# Patient Record
Sex: Female | Born: 1965 | Race: Black or African American | Hispanic: No | Marital: Married | Smoking: Never smoker
Health system: Southern US, Community
[De-identification: ages and names within clinical notes are randomized; demographics above are authoritative.]

## PROBLEM LIST (undated history)

## (undated) DIAGNOSIS — D649 Anemia, unspecified: Secondary | ICD-10-CM

## (undated) DIAGNOSIS — J189 Pneumonia, unspecified organism: Secondary | ICD-10-CM

## (undated) HISTORY — PX: ABDOMINAL HYSTERECTOMY: SHX81

---

## 1999-05-22 ENCOUNTER — Emergency Department (HOSPITAL_COMMUNITY): Admission: EM | Admit: 1999-05-22 | Discharge: 1999-05-22 | Payer: Self-pay | Admitting: Emergency Medicine

## 2003-10-06 ENCOUNTER — Encounter: Admission: RE | Admit: 2003-10-06 | Discharge: 2003-10-06 | Payer: Self-pay | Admitting: *Deleted

## 2003-10-06 ENCOUNTER — Ambulatory Visit (HOSPITAL_COMMUNITY): Admission: RE | Admit: 2003-10-06 | Discharge: 2003-10-06 | Payer: Self-pay | Admitting: *Deleted

## 2003-10-15 ENCOUNTER — Encounter: Admission: RE | Admit: 2003-10-15 | Discharge: 2003-10-15 | Payer: Self-pay | Admitting: *Deleted

## 2003-10-19 ENCOUNTER — Observation Stay (HOSPITAL_COMMUNITY): Admission: AD | Admit: 2003-10-19 | Discharge: 2003-10-20 | Payer: Self-pay | Admitting: Obstetrics and Gynecology

## 2003-10-22 ENCOUNTER — Encounter: Admission: RE | Admit: 2003-10-22 | Discharge: 2003-10-22 | Payer: Self-pay | Admitting: *Deleted

## 2003-10-27 ENCOUNTER — Inpatient Hospital Stay (HOSPITAL_COMMUNITY): Admission: RE | Admit: 2003-10-27 | Discharge: 2003-10-28 | Payer: Self-pay | Admitting: *Deleted

## 2003-11-04 ENCOUNTER — Encounter: Admission: RE | Admit: 2003-11-04 | Discharge: 2003-11-04 | Payer: Self-pay | Admitting: *Deleted

## 2003-11-18 ENCOUNTER — Encounter: Admission: RE | Admit: 2003-11-18 | Discharge: 2003-11-18 | Payer: Self-pay | Admitting: *Deleted

## 2003-11-25 ENCOUNTER — Inpatient Hospital Stay (HOSPITAL_COMMUNITY): Admission: AD | Admit: 2003-11-25 | Discharge: 2003-11-29 | Payer: Self-pay | Admitting: Obstetrics & Gynecology

## 2003-11-25 ENCOUNTER — Encounter: Admission: RE | Admit: 2003-11-25 | Discharge: 2003-11-25 | Payer: Self-pay | Admitting: *Deleted

## 2003-12-02 ENCOUNTER — Encounter: Admission: RE | Admit: 2003-12-02 | Discharge: 2003-12-02 | Payer: Self-pay | Admitting: *Deleted

## 2003-12-09 ENCOUNTER — Encounter: Admission: RE | Admit: 2003-12-09 | Discharge: 2003-12-09 | Payer: Self-pay | Admitting: *Deleted

## 2003-12-10 ENCOUNTER — Ambulatory Visit (HOSPITAL_COMMUNITY): Admission: RE | Admit: 2003-12-10 | Discharge: 2003-12-10 | Payer: Self-pay | Admitting: *Deleted

## 2003-12-16 ENCOUNTER — Encounter: Admission: RE | Admit: 2003-12-16 | Discharge: 2003-12-16 | Payer: Self-pay | Admitting: *Deleted

## 2003-12-23 ENCOUNTER — Encounter: Admission: RE | Admit: 2003-12-23 | Discharge: 2003-12-23 | Payer: Self-pay | Admitting: *Deleted

## 2003-12-30 ENCOUNTER — Encounter: Admission: RE | Admit: 2003-12-30 | Discharge: 2003-12-30 | Payer: Self-pay | Admitting: *Deleted

## 2003-12-30 ENCOUNTER — Ambulatory Visit (HOSPITAL_COMMUNITY): Admission: RE | Admit: 2003-12-30 | Discharge: 2003-12-30 | Payer: Self-pay | Admitting: *Deleted

## 2004-01-04 ENCOUNTER — Inpatient Hospital Stay (HOSPITAL_COMMUNITY): Admission: AD | Admit: 2004-01-04 | Discharge: 2004-01-12 | Payer: Self-pay | Admitting: Obstetrics & Gynecology

## 2015-10-26 ENCOUNTER — Other Ambulatory Visit: Payer: Self-pay | Admitting: Internal Medicine

## 2015-10-26 DIAGNOSIS — E785 Hyperlipidemia, unspecified: Secondary | ICD-10-CM

## 2015-12-21 ENCOUNTER — Other Ambulatory Visit: Payer: Self-pay | Admitting: Internal Medicine

## 2015-12-21 DIAGNOSIS — E559 Vitamin D deficiency, unspecified: Secondary | ICD-10-CM

## 2016-02-24 DIAGNOSIS — J189 Pneumonia, unspecified organism: Secondary | ICD-10-CM

## 2016-02-24 HISTORY — DX: Pneumonia, unspecified organism: J18.9

## 2016-06-17 ENCOUNTER — Emergency Department (HOSPITAL_BASED_OUTPATIENT_CLINIC_OR_DEPARTMENT_OTHER): Payer: Self-pay

## 2016-06-17 ENCOUNTER — Emergency Department (HOSPITAL_BASED_OUTPATIENT_CLINIC_OR_DEPARTMENT_OTHER)
Admission: EM | Admit: 2016-06-17 | Discharge: 2016-06-17 | Disposition: A | Discharge status: Home / Self Care | Payer: Self-pay | Attending: Emergency Medicine | Admitting: Emergency Medicine

## 2016-06-17 ENCOUNTER — Encounter (HOSPITAL_BASED_OUTPATIENT_CLINIC_OR_DEPARTMENT_OTHER): Payer: Self-pay

## 2016-06-17 DIAGNOSIS — R519 Headache, unspecified: Secondary | ICD-10-CM

## 2016-06-17 DIAGNOSIS — M436 Torticollis: Secondary | ICD-10-CM | POA: Insufficient documentation

## 2016-06-17 DIAGNOSIS — R51 Headache: Secondary | ICD-10-CM

## 2016-06-17 DIAGNOSIS — H9202 Otalgia, left ear: Secondary | ICD-10-CM | POA: Insufficient documentation

## 2016-06-17 HISTORY — DX: Pneumonia, unspecified organism: J18.9

## 2016-06-17 HISTORY — DX: Anemia, unspecified: D64.9

## 2016-06-17 LAB — CBC WITH DIFFERENTIAL/PLATELET
BASOS ABS: 0 10*3/uL (ref 0.0–0.1)
Basophils Relative: 0 %
Eosinophils Absolute: 0.2 10*3/uL (ref 0.0–0.7)
Eosinophils Relative: 2 %
HCT: 39.1 % (ref 36.0–46.0)
Hemoglobin: 12.8 g/dL (ref 12.0–15.0)
Lymphocytes Relative: 15 %
Lymphs Abs: 1.3 10*3/uL (ref 0.7–4.0)
MCH: 28.4 pg (ref 26.0–34.0)
MCHC: 32.7 g/dL (ref 30.0–36.0)
MCV: 86.7 fL (ref 78.0–100.0)
Monocytes Absolute: 0.9 10*3/uL (ref 0.1–1.0)
Monocytes Relative: 11 %
Neutro Abs: 5.9 10*3/uL (ref 1.7–7.7)
Neutrophils Relative %: 72 %
Platelets: 239 10*3/uL (ref 150–400)
RBC: 4.51 MIL/uL (ref 3.87–5.11)
RDW: 15.3 % (ref 11.5–15.5)
WBC: 8.3 10*3/uL (ref 4.0–10.5)

## 2016-06-17 LAB — BASIC METABOLIC PANEL
Anion gap: 6 (ref 5–15)
BUN: 15 mg/dL (ref 6–20)
CALCIUM: 9.2 mg/dL (ref 8.9–10.3)
CO2: 28 mmol/L (ref 22–32)
Chloride: 105 mmol/L (ref 101–111)
Creatinine, Ser: 0.76 mg/dL (ref 0.44–1.00)
GFR calc Af Amer: >60 mL/min (ref >60)
GFR calc non Af Amer: >60 mL/min (ref >60)
Glucose, Bld: 103 mg/dL — ABNORMAL HIGH (ref 65–99)
Potassium: 3.6 mmol/L (ref 3.5–5.1)
SODIUM: 139 mmol/L (ref 135–145)

## 2016-06-17 LAB — TROPONIN I: Troponin I: <0.03 ng/mL (ref <0.03)

## 2016-06-17 MED ORDER — NAPROXEN 500 MG PO TABS: 500.0000 mg | ORAL_TABLET | Freq: Two times a day (BID) | ORAL | 0 refills | Status: AC

## 2016-06-17 MED ORDER — MAGNESIUM SULFATE 2 GM/50ML IV SOLN
2.0000 g | Freq: Once | INTRAVENOUS | Status: AC
  Administered 2016-06-17: 2 g via INTRAVENOUS
  Filled 2016-06-17: qty 50

## 2016-06-17 MED ORDER — DIPHENHYDRAMINE HCL 50 MG/ML IJ SOLN
25.0000 mg | Freq: Once | INTRAMUSCULAR | Status: AC
  Administered 2016-06-17: 25 mg via INTRAVENOUS
  Filled 2016-06-17: qty 1

## 2016-06-17 MED ORDER — KETOROLAC TROMETHAMINE 30 MG/ML IJ SOLN
30.0000 mg | Freq: Once | INTRAMUSCULAR | Status: AC
  Administered 2016-06-17: 30 mg via INTRAVENOUS
  Filled 2016-06-17: qty 1

## 2016-06-17 MED ORDER — DEXAMETHASONE SODIUM PHOSPHATE 10 MG/ML IJ SOLN
10.0000 mg | Freq: Once | INTRAMUSCULAR | Status: AC
  Administered 2016-06-17: 10 mg via INTRAVENOUS
  Filled 2016-06-17: qty 1

## 2016-06-17 MED ORDER — METOCLOPRAMIDE HCL 10 MG PO TABS: 10.0000 mg | ORAL_TABLET | Freq: Four times a day (QID) | ORAL | 0 refills | Status: AC | PRN

## 2016-06-17 MED ORDER — METOCLOPRAMIDE HCL 5 MG/ML IJ SOLN
10.0000 mg | Freq: Once | INTRAMUSCULAR | Status: AC
  Administered 2016-06-17: 10 mg via INTRAVENOUS
  Filled 2016-06-17: qty 2

## 2016-06-17 MED ORDER — DIVALPROEX SODIUM ER 250 MG PO TB24
ORAL_TABLET | ORAL | Status: AC
  Administered 2016-06-17: 500 mg
  Filled 2016-06-17: qty 2

## 2016-06-17 MED ORDER — DIVALPROEX SODIUM 500 MG PO DR TAB
500.0000 mg | DELAYED_RELEASE_TABLET | Freq: Two times a day (BID) | ORAL | Status: DC
  Filled 2016-06-17: qty 1

## 2016-06-17 MED ORDER — METHOCARBAMOL 1000 MG/10ML IJ SOLN
1000.0000 mg | Freq: Once | INTRAMUSCULAR | Status: AC
  Administered 2016-06-17: 1000 mg via INTRAVENOUS
  Filled 2016-06-17: qty 10

## 2016-06-17 MED ORDER — PROMETHAZINE HCL 25 MG/ML IJ SOLN
25.0000 mg | Freq: Once | INTRAMUSCULAR | Status: AC
  Administered 2016-06-17: 25 mg via INTRAVENOUS
  Filled 2016-06-17: qty 1

## 2016-06-17 MED ORDER — METHOCARBAMOL 500 MG PO TABS: 500.0000 mg | ORAL_TABLET | Freq: Two times a day (BID) | ORAL | 0 refills | Status: AC

## 2016-06-17 NOTE — ED Provider Notes (Signed)
[facility] Provider Note   CSN: [id number] Arrival date & time: 06/17/16  0357     History   Chief Complaint Chief Complaint  Patient presents with  . Facial Pain    HPI Theresa Davies is a 50 y.o. female.  The history is provided by the patient.  Otalgia  This is a new problem. The current episode started yesterday. There is pain in the left ear. The problem occurs constantly. The problem has not changed since onset.There has been no fever. The pain is severe. Pertinent negatives include no ear discharge, no hearing loss, no rhinorrhea, no sore throat, no abdominal pain, no diarrhea, no vomiting, no cough and no rash. Associated symptoms comments: Facial pain.. Her past medical history does not include chronic ear infection.  Awoke yesterday am with left sided facial pain and ear pain that radiates all the way into the clavicle on the left.  No f/c/r. No n/v/d.  No rashes.  Patient states she did sleep on that side the previous night.  Movement makes the pain worse.  She states that neither ibuprofen at 5 pm or percocet helped the pain.  Her pain is best when she leans forward and worse when she moves head from side to side.    Past Medical History:  Diagnosis Date  . Anemia   . PNA (pneumonia) 02/2016    There are no active problems to display for this patient.   Past Surgical History:  Procedure Laterality Date  . ABDOMINAL HYSTERECTOMY      OB History    No data available       Home Medications    Prior to Admission medications   Not on File    Family History No family history on file.  Social History Social History  Substance Use Topics  . Smoking status: Never Smoker  . Smokeless tobacco: Never Used  . Alcohol use No     Allergies   Review of patient's allergies indicates no known allergies.   Review of Systems Review of Systems  Constitutional: Negative for chills, diaphoresis and fever.  HENT: Positive for ear pain. Negative  for congestion, drooling, ear discharge, facial swelling, hearing loss, rhinorrhea, sinus pressure, sore throat, tinnitus, trouble swallowing and voice change.   Eyes: Negative for photophobia and visual disturbance.  Respiratory: Negative for cough.   Cardiovascular: Negative for chest pain.  Gastrointestinal: Negative for abdominal pain, diarrhea and vomiting.  Musculoskeletal: Negative for gait problem.  Skin: Negative for rash.  Neurological: Negative for dizziness, tremors, seizures, syncope, facial asymmetry, weakness, light-headedness and numbness.  Psychiatric/Behavioral: Negative for confusion.  All other systems reviewed and are negative.    Physical Exam Updated Vital Signs BP (!) 190/106 (BP Location: Right Arm)   Pulse 69   Temp 97.4 F (36.3 C) (Oral)   Resp 20   Ht 5\' 10"  (1.778 m)   Wt 270 lb (122.5 kg)   SpO2 100%   BMI 38.74 kg/m   Physical Exam  Constitutional: She is oriented to person, place, and time. She appears well-developed and well-nourished. No distress.  HENT:  Head: Normocephalic and atraumatic.  Right Ear: External ear normal. No mastoid tenderness. Tympanic membrane is not injected, not scarred, not perforated and not erythematous.  Left Ear: External ear normal. No mastoid tenderness. Tympanic membrane is not injected, not scarred, not perforated and not erythematous. No hemotympanum.  Nose: Nose normal.  Mouth/Throat: Oropharynx is clear and moist. No oropharyngeal exudate.  Eyes: Conjunctivae  and EOM are normal. Pupils are equal, round, and reactive to light.  No proptosis, no nystagmus  Neck: Trachea normal and phonation normal. Neck supple. No JVD present. No spinous process tenderness and no muscular tenderness present. No neck rigidity. No tracheal deviation, no edema and no erythema present. No Brudzinski's sign and no Kernig's sign noted.  Cardiovascular: Normal rate, regular rhythm and intact distal pulses.   Pulmonary/Chest: Effort  normal and breath sounds normal. No stridor. No respiratory distress. She has no wheezes. She has no rales.  Abdominal: Soft. Bowel sounds are normal. She exhibits no mass. There is no tenderness. There is no guarding.  Musculoskeletal: Normal range of motion.  Lymphadenopathy:    She has no cervical adenopathy.  Neurological: She is alert and oriented to person, place, and time. She has normal reflexes. She displays normal reflexes. No cranial nerve deficit.  Skin: Skin is warm and dry. Capillary refill takes less than 2 seconds. She is not diaphoretic.  Psychiatric: Thought content normal.  Intact cognition.    Nursing note and vitals reviewed.    ED Treatments / Results   Vitals:   06/17/16 0411  BP: (!) 190/106  Pulse: 69  Resp: 20  Temp: 97.4 F (36.3 C)   Results for orders placed or performed during the hospital encounter of 06/17/16  CBC with Differential/Platelet  Result Value Ref Range   WBC 8.3 4.0 - 10.5 K/uL   RBC 4.51 3.87 - 5.11 MIL/uL   Hemoglobin 12.8 12.0 - 15.0 g/dL   HCT 54.0 98.1 - 19.1 %   MCV 86.7 78.0 - 100.0 fL   MCH 28.4 26.0 - 34.0 pg   MCHC 32.7 30.0 - 36.0 g/dL   RDW 47.8 29.5 - 62.1 %   Platelets 239 150 - 400 K/uL   Neutrophils Relative % 72 %   Neutro Abs 5.9 1.7 - 7.7 K/uL   Lymphocytes Relative 15 %   Lymphs Abs 1.3 0.7 - 4.0 K/uL   Monocytes Relative 11 %   Monocytes Absolute 0.9 0.1 - 1.0 K/uL   Eosinophils Relative 2 %   Eosinophils Absolute 0.2 0.0 - 0.7 K/uL   Basophils Relative 0 %   Basophils Absolute 0.0 0.0 - 0.1 K/uL  Basic metabolic panel  Result Value Ref Range   Sodium 139 135 - 145 mmol/L   Potassium 3.6 3.5 - 5.1 mmol/L   Chloride 105 101 - 111 mmol/L   CO2 28 22 - 32 mmol/L   Glucose, Bld 103 (H) 65 - 99 mg/dL   BUN 15 6 - 20 mg/dL   Creatinine, Ser 3.08 0.44 - 1.00 mg/dL   Calcium 9.2 8.9 - 65.7 mg/dL   GFR calc non Af Amer >60 >60 mL/min   GFR calc Af Amer >60 >60 mL/min   Anion gap 6 5 - 15  Troponin I    Result Value Ref Range   Troponin I <0.03 <0.03 ng/mL   Ct Head Wo Contrast  Result Date: 06/17/2016 CLINICAL DATA:  Pain in the left side of the jaw, neck, year, and I all day. Worse when lying down. Nausea. EXAM: CT HEAD WITHOUT CONTRAST TECHNIQUE: Contiguous axial images were obtained from the base of the skull through the vertex without intravenous contrast. COMPARISON:  02/14/2016 FINDINGS: Brain: No evidence of acute infarction, hemorrhage, hydrocephalus, extra-axial collection or mass lesion/mass effect. Vascular: No hyperdense vessel or unexpected calcification. Skull: Normal. Negative for fracture or focal lesion. Sinuses/Orbits: No acute finding. Other: No significant  change in the intracranial contents since previous study. IMPRESSION: No acute intracranial abnormalities. Electronically Signed   By: Burman NievesWilliam  Stevens M.D.   On: 06/17/2016 05:33     EKG  EKG Interpretation  Date/Time:  Saturday June 17 2016 05:05:32 EDT Ventricular Rate:  62 PR Interval:    QRS Duration: 98 QT Interval:  447 QTC Calculation: 454 R Axis:   14 Text Interpretation:  Sinus rhythm Confirmed by [place]  MD, Deshonna Trnka ([id number]) on 06/17/2016 5:37:07 AM       Radiology No results found.  Procedures Procedures (including critical care time)  Medications Ordered in ED Results for orders placed or performed during the hospital encounter of 06/17/16  CBC with Differential/Platelet  Result Value Ref Range   WBC 8.3 4.0 - 10.5 K/uL   RBC 4.51 3.87 - 5.11 MIL/uL   Hemoglobin 12.8 12.0 - 15.0 g/dL   HCT 86.539.1 78.436.0 - 69.646.0 %   MCV 86.7 78.0 - 100.0 fL   MCH 28.4 26.0 - 34.0 pg   MCHC 32.7 30.0 - 36.0 g/dL   RDW 29.515.3 28.411.5 - 13.215.5 %   Platelets 239 150 - 400 K/uL   Neutrophils Relative % 72 %   Neutro Abs 5.9 1.7 - 7.7 K/uL   Lymphocytes Relative 15 %   Lymphs Abs 1.3 0.7 - 4.0 K/uL   Monocytes Relative 11 %   Monocytes Absolute 0.9 0.1 - 1.0 K/uL   Eosinophils Relative 2 %   Eosinophils  Absolute 0.2 0.0 - 0.7 K/uL   Basophils Relative 0 %   Basophils Absolute 0.0 0.0 - 0.1 K/uL  Basic metabolic panel  Result Value Ref Range   Sodium 139 135 - 145 mmol/L   Potassium 3.6 3.5 - 5.1 mmol/L   Chloride 105 101 - 111 mmol/L   CO2 28 22 - 32 mmol/L   Glucose, Bld 103 (H) 65 - 99 mg/dL   BUN 15 6 - 20 mg/dL   Creatinine, Ser 4.400.76 0.44 - 1.00 mg/dL   Calcium 9.2 8.9 - 10.210.3 mg/dL   GFR calc non Af Amer >60 >60 mL/min   GFR calc Af Amer >60 >60 mL/min   Anion gap 6 5 - 15  Troponin I  Result Value Ref Range   Troponin I <0.03 <0.03 ng/mL   Ct Head Wo Contrast  Result Date: 06/17/2016 CLINICAL DATA:  Pain in the left side of the jaw, neck, year, and I all day. Worse when lying down. Nausea. EXAM: CT HEAD WITHOUT CONTRAST TECHNIQUE: Contiguous axial images were obtained from the base of the skull through the vertex without intravenous contrast. COMPARISON:  02/14/2016 FINDINGS: Brain: No evidence of acute infarction, hemorrhage, hydrocephalus, extra-axial collection or mass lesion/mass effect. Vascular: No hyperdense vessel or unexpected calcification. Skull: Normal. Negative for fracture or focal lesion. Sinuses/Orbits: No acute finding. Other: No significant change in the intracranial contents since previous study. IMPRESSION: No acute intracranial abnormalities. Electronically Signed   By: Burman NievesWilliam  Stevens M.D.   On: 06/17/2016 05:33  Heat applied to the neck.  Patient now sleeping  Initial Impression / Assessment and Plan / ED Course  I have reviewed the triage vital signs and the nursing notes.  Pertinent labs & imaging results that were available during my care of the patient were reviewed by me and considered in my medical decision making (see chart for details).   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  Given that  symptoms have been going on for 24 hours, we would have seen changes on the head CT.  All questions answered to patient's satisfaction. Based on history and exam patient has been appropriately medically screened and emergency conditions excluded. Patient is stable for discharge at this time. Follow up with your PMD for recheck in 2 days and strict return precautions given New Prescriptions New Prescriptions   No medications on file     Errik Mitchelle, MD 06/17/16 [phone]

## 2016-06-17 NOTE — ED Triage Notes (Signed)
Pt c/o pain to lt side of jaw, neck, ear, eye pain all day and worse when laying down, states is nauseated now

## 2018-07-26 IMAGING — CT CT HEAD W/O CM
3 series · 15 of 47 positions shown, 18 images · non-contrast
Comparison: 02/14/2016

CLINICAL DATA: Pain in the left side of the jaw, neck, year, and I
all day. Worse when lying down. Nausea.

EXAM:
CT HEAD WITHOUT CONTRAST
TECHNIQUE: Contiguous axial images were obtained from the base of the skull
through the vertex without intravenous contrast.

[Series 2: head wo · axial · 0.47mm/px · z∈[+1039,+1164]mm · 9 of 31 slices shown, 12 images]
[im 3/31  brain]
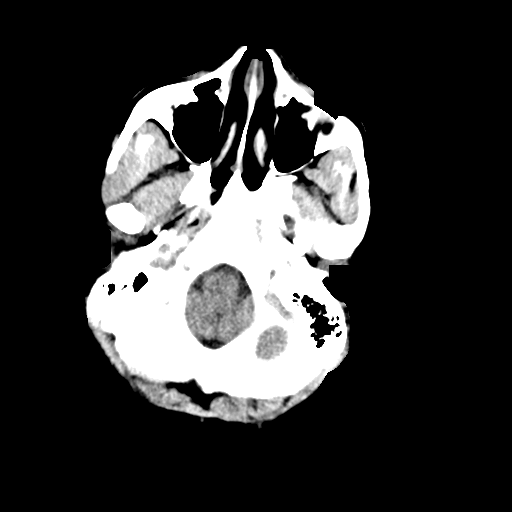
[im 3/31  bone]
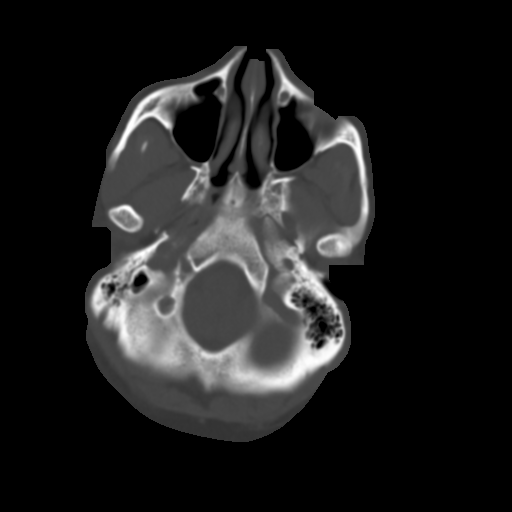
[im 6/31  brain]
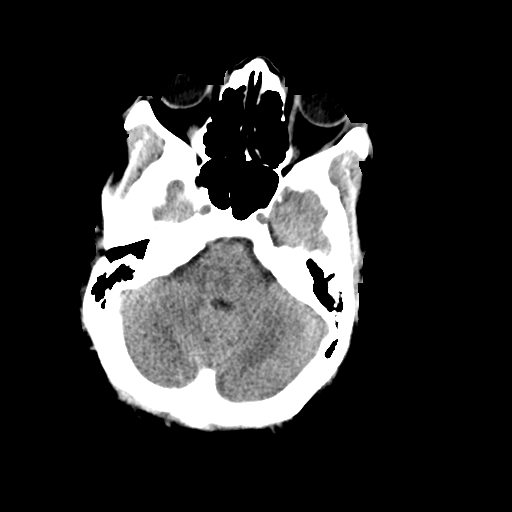
[im 9/31  brain]
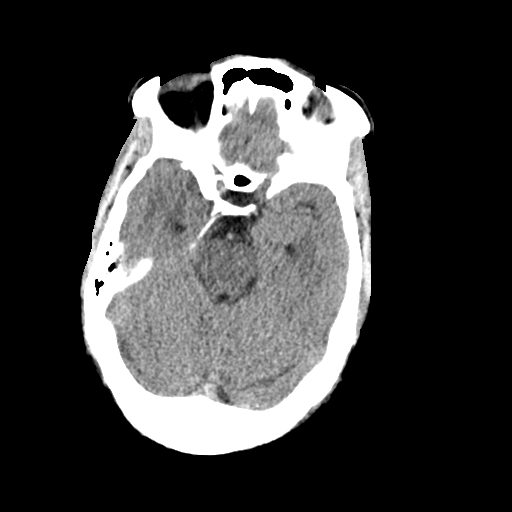
[im 12/31  brain]
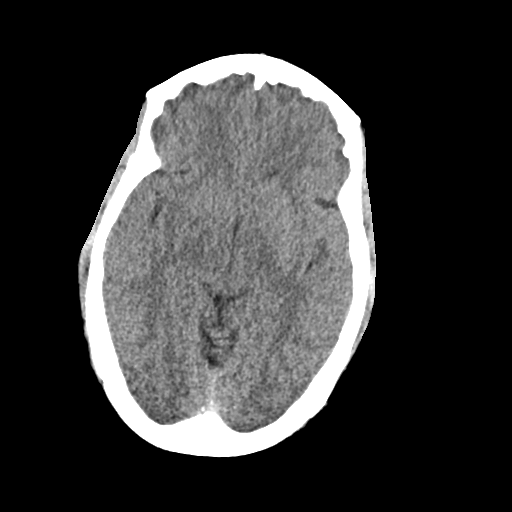
[im 16/31  brain]
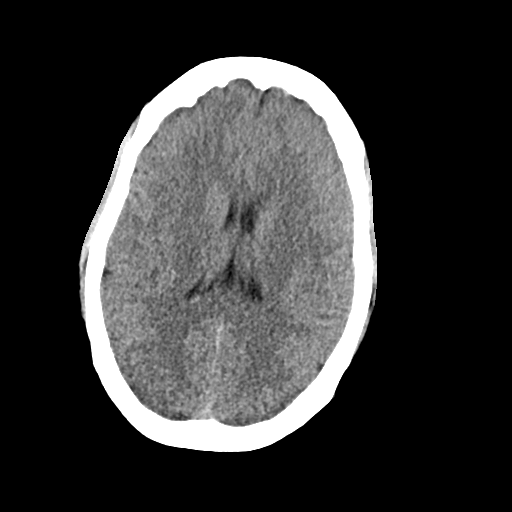
[im 16/31  bone]
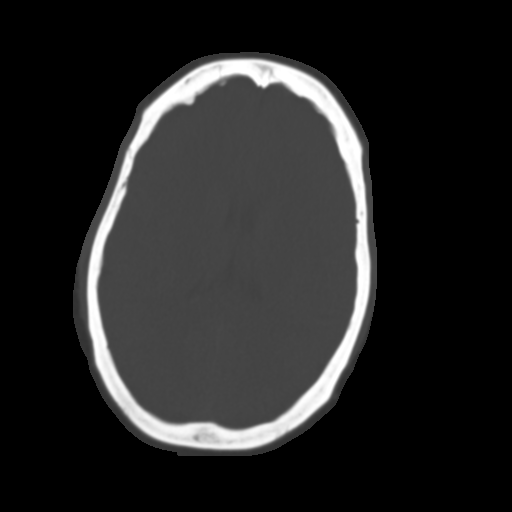
[im 19/31  brain]
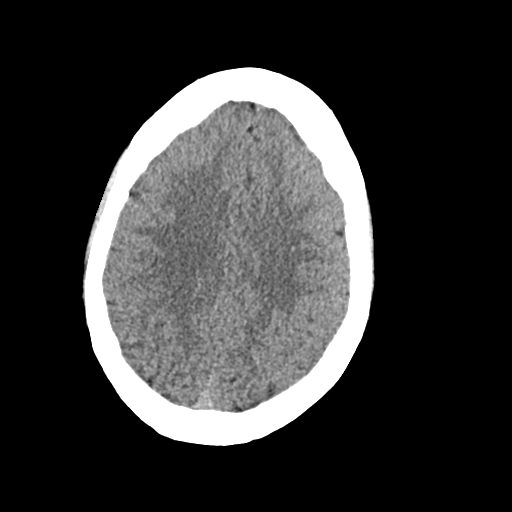
[im 22/31  brain]
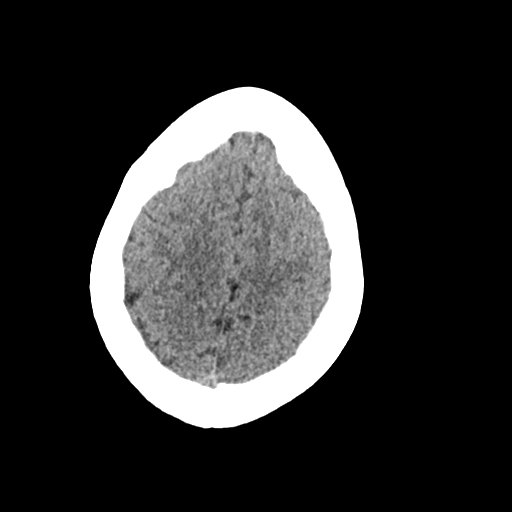
[im 25/31  brain]
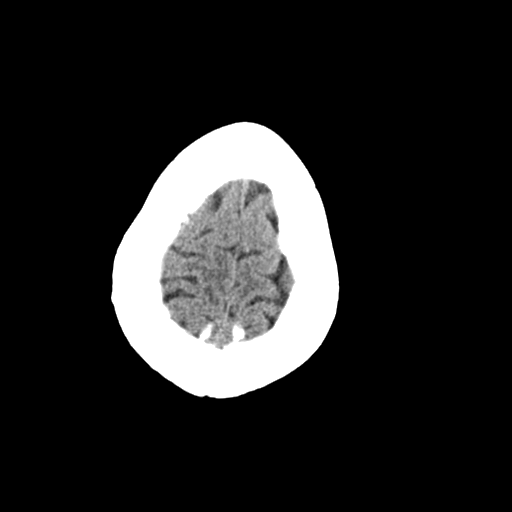
[im 28/31  brain]
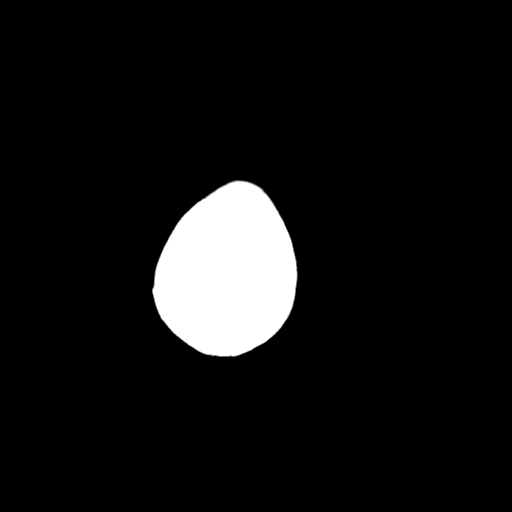
[im 28/31  bone]
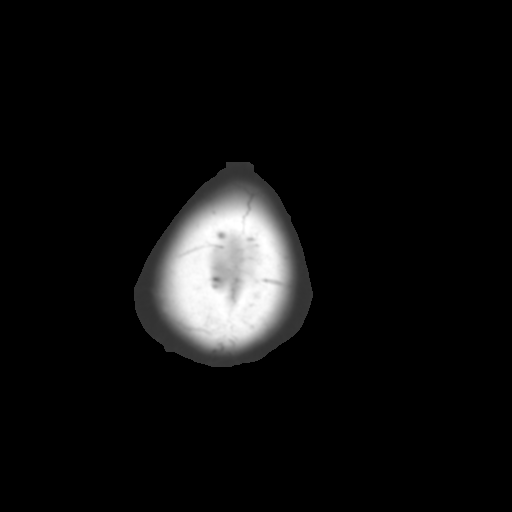

[Series 4: coronal soft · coronal · 0.31mm/px · 3 of 71 slices shown]
[im 24/71  brain]
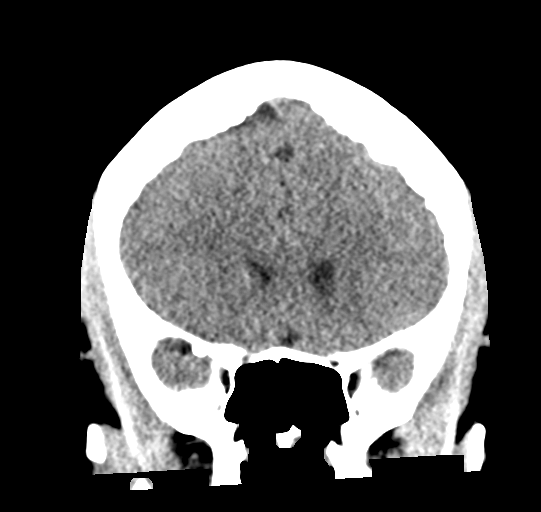
[im 32/71  brain]
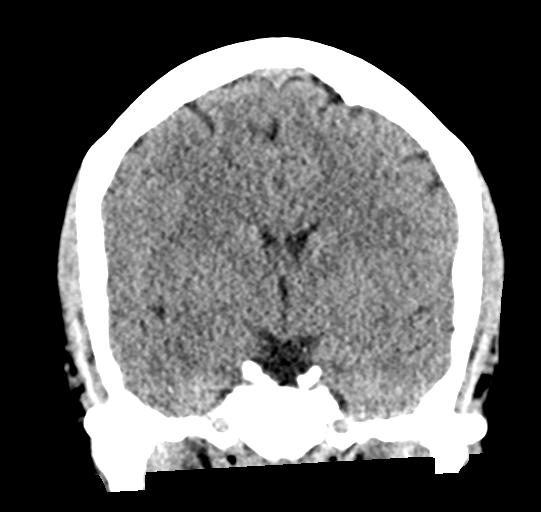
[im 39/71  brain]
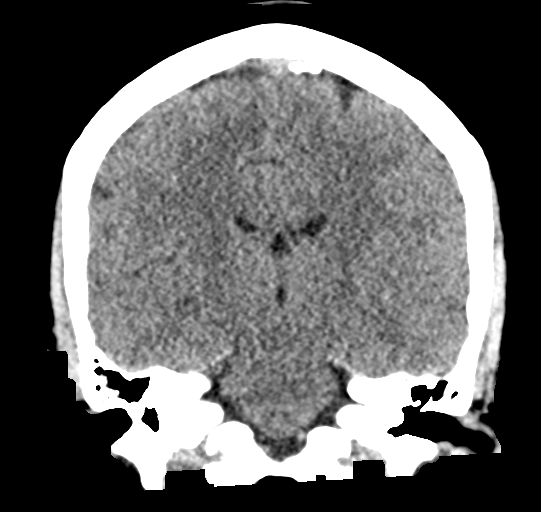

[Series 5: sag soft · sagittal · 0.30mm/px · 3 of 67 slices shown]
[im 23/67  brain]
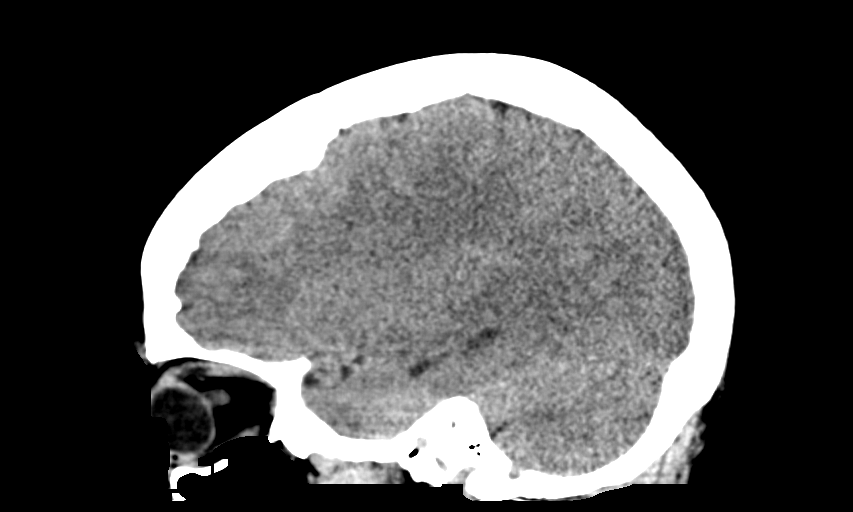
[im 34/67  brain]
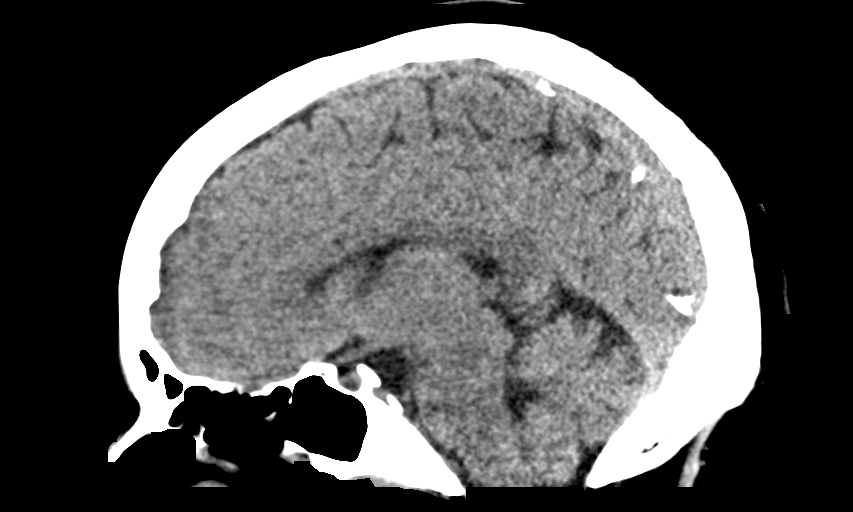
[im 45/67  brain]
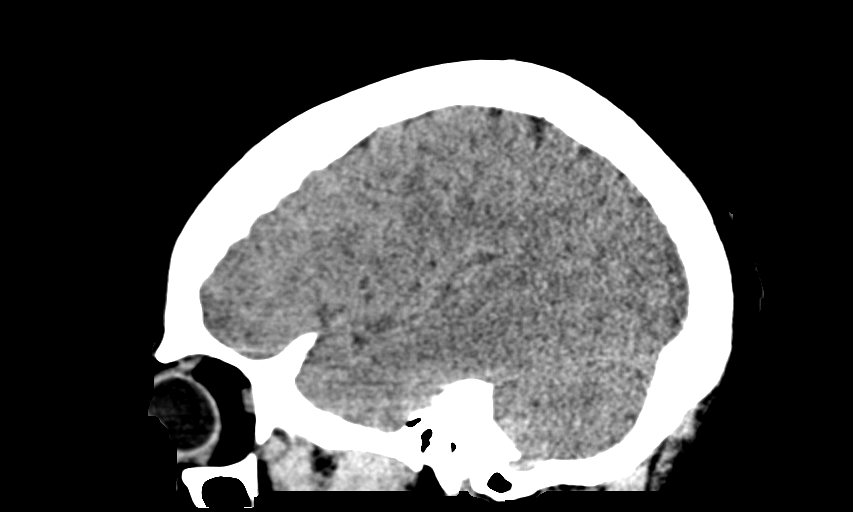

[15 of 47 positions shown; findings below may reference images not displayed]

FINDINGS: Brain: No evidence of acute infarction, hemorrhage, hydrocephalus,
extra-axial collection or mass lesion/mass effect.

Vascular: No hyperdense vessel or unexpected calcification.

Skull: Normal. Negative for fracture or focal lesion.

Sinuses/Orbits: No acute finding.

Other: No significant change in the intracranial contents since
previous study.
IMPRESSION: No acute intracranial abnormalities.

## 2022-03-19 ENCOUNTER — Emergency Department (HOSPITAL_BASED_OUTPATIENT_CLINIC_OR_DEPARTMENT_OTHER)
Admission: EM | Admit: 2022-03-19 | Discharge: 2022-03-19 | Disposition: A | Discharge status: Home / Self Care | Payer: PRIVATE HEALTH INSURANCE | Attending: Emergency Medicine | Admitting: Emergency Medicine

## 2022-03-19 ENCOUNTER — Encounter (HOSPITAL_BASED_OUTPATIENT_CLINIC_OR_DEPARTMENT_OTHER): Payer: Self-pay | Admitting: Emergency Medicine

## 2022-03-19 ENCOUNTER — Other Ambulatory Visit: Payer: Self-pay

## 2022-03-19 DIAGNOSIS — G43019 Migraine without aura, intractable, without status migrainosus: Secondary | ICD-10-CM

## 2022-03-19 DIAGNOSIS — G43919 Migraine, unspecified, intractable, without status migrainosus: Secondary | ICD-10-CM | POA: Insufficient documentation

## 2022-03-19 DIAGNOSIS — R519 Headache, unspecified: Secondary | ICD-10-CM | POA: Diagnosis present

## 2022-03-19 MED ORDER — KETOROLAC TROMETHAMINE 15 MG/ML IJ SOLN
15.0000 mg | Freq: Once | INTRAMUSCULAR | Status: AC
  Administered 2022-03-19: 15 mg via INTRAVENOUS
  Filled 2022-03-19: qty 1

## 2022-03-19 MED ORDER — SODIUM CHLORIDE 0.9 % IV SOLN: INTRAVENOUS | Status: DC

## 2022-03-19 MED ORDER — PROCHLORPERAZINE EDISYLATE 10 MG/2ML IJ SOLN
10.0000 mg | Freq: Once | INTRAMUSCULAR | Status: AC
  Administered 2022-03-19: 10 mg via INTRAVENOUS
  Filled 2022-03-19: qty 2

## 2022-03-19 MED ORDER — DIPHENHYDRAMINE HCL 50 MG/ML IJ SOLN
12.5000 mg | Freq: Once | INTRAMUSCULAR | Status: AC
Start: 2022-03-19 — End: 2022-03-19
  Administered 2022-03-19: 12.5 mg via INTRAVENOUS
  Filled 2022-03-19: qty 1

## 2022-03-19 MED ORDER — SODIUM CHLORIDE 0.9 % IV BOLUS
1000.0000 mL | Freq: Once | INTRAVENOUS | Status: AC
Start: 2022-03-19 — End: 2022-03-19
  Administered 2022-03-19: 1000 mL via INTRAVENOUS

## 2022-03-19 NOTE — ED Provider Notes (Signed)
[facility] EMERGENCY DEPARTMENT Provider Note   CSN: [id number] Arrival date & time: 03/19/22  1142     History  Chief Complaint  Patient presents with   Facial Swelling   Headache    Theresa Davies is a 56 y.o. female presenting with 2 days worth of a headache and 3 days worth of a left eye stye.  Unable to treat the headache at home with Tylenol and ibuprofen.  Has a history of headaches but they usually subside.  Says this feels different because it is worse.  Was not sudden onset, gradually worsened.  Left eye stye has not gone away despite ice packs, warm compresses and NSAIDs.  Does not wear eye make-up, no contact lenses.     Headache Pain location:  L temporal, R temporal and frontal Quality:  Dull Severity currently:  8/10 Onset quality:  Gradual Duration:  2 days Progression:  Worsening Chronicity:  Recurrent Similar to prior headaches: no   Ineffective treatments:  Acetaminophen and NSAIDs Associated symptoms: nausea        Home Medications Prior to Admission medications   Medication Sig Start Date End Date Taking? Authorizing Provider  methocarbamol (ROBAXIN) 500 MG tablet Take 1 tablet (500 mg total) by mouth 2 (two) times daily. 06/17/16   Palumbo, April, MD  metoCLOPramide (REGLAN) 10 MG tablet Take 1 tablet (10 mg total) by mouth every 6 (six) hours as needed for nausea (nausea/headache). 06/17/16   Palumbo, April, MD  naproxen (NAPROSYN) 500 MG tablet Take 1 tablet (500 mg total) by mouth 2 (two) times daily. 06/17/16   Palumbo, April, MD      Allergies    Patient has no known allergies.    Review of Systems   Review of Systems  Gastrointestinal:  Positive for nausea.  Neurological:  Positive for headaches.    Physical Exam Updated Vital Signs BP (!) 151/110 (BP Location: Left Arm)   Pulse 90   Temp 97.8 F (36.6 C) (Oral)   Resp 18   SpO2 98%  Physical Exam Vitals and nursing note reviewed.  Constitutional:      Appearance:  Normal appearance.  HENT:     Head: Normocephalic and atraumatic.  Eyes:     General: No visual field deficit or scleral icterus.    Conjunctiva/sclera: Conjunctivae normal.     Comments: Swelling to the left superior eyelid.  Apparent papule consistent with stye.  No pustule, drainage.  No cellulitis but does have mild superior orbital swelling  Pulmonary:     Effort: Pulmonary effort is normal. No respiratory distress.  Skin:    Findings: No rash.  Neurological:     Mental Status: She is alert.     GCS: GCS eye subscore is 4. GCS verbal subscore is 5. GCS motor subscore is 6.     Cranial Nerves: No cranial nerve deficit or dysarthria.     Comments: Cranial nerves II through XII grossly intact.  No problem with EOMs.  No problem with finger-to-nose.  5 out of 5 strength in bilateral upper and lower extremities.  Psychiatric:        Mood and Affect: Mood normal.     ED Results / Procedures / Treatments   Labs (all labs ordered are listed, but only abnormal results are displayed) Labs Reviewed - No data to display  EKG None  Radiology No results found.  Procedures Procedures   Medications Ordered in ED Medications  sodium chloride 0.9 % bolus 1,000  mL (1,000 mLs Intravenous New Bag/Given 03/19/22 1248)    And  0.9 %  sodium chloride infusion (has no administration in time range)  ketorolac (TORADOL) 15 MG/ML injection 15 mg (15 mg Intravenous Given 03/19/22 1252)  prochlorperazine (COMPAZINE) injection 10 mg (10 mg Intravenous Given 03/19/22 1251)  diphenhydrAMINE (BENADRYL) injection 12.5 mg (12.5 mg Intravenous Given 03/19/22 1250)    ED Course/ Medical Decision Making/ A&P Clinical Course as of 03/19/22 1317  Sun Mar 19, 2022  1317 Patient reports feeling better and she is ready to leave.  This is reasonable.  Neurologically intact.  Will be discharged [MR]    Clinical Course User Index [MR] Laker Thompson, Gabriel Cirri, PA-C                           Medical Decision  Making Risk Prescription drug management.   This patient presents to the ED for concern of headache.  Differential includes but is not limited to subarachnoid hemorrhage, meningitis, temporal arteritis, glaucoma, cerebral ischemia, carotid/vertebral dissection, intracranial tumor, Venous sinus thrombosis, carbon monoxide poisoning, acute or chronic subdural hemorrhage This is not an exhaustive differential.     Physical Exam: Pertinent physical exam findings include normal neurologic exam.  No signs of cellulitis, EOMs intact and PERRLA.   Imaging Studies: Considered however with gradual onset headache, no red flags outside of her age and normal neurologic exam I will defer CT imaging at this time   Medications: I ordered medication including Toradol, Compazine and Benadryl and IVF. Reevaluation of the patient after these medicines showed that the patient improved. I have reviewed the patients home medicines and have made adjustments as needed.  Disposition: 56 year old female presented today for headache.  Differential included subarachnoid hemorrhage, CVA, other intracranial hemorrhage, meningitis, tension and migraine headaches.  Given migraine cocktail and no imaging secondary to normal exam.  She reported that this made her feel better and that she would like to leave and take other medications at home.  This is reasonable.  Remains neurologically intact and ambulatory.  Will be discharged home and will continue conservative treatment of headache and stye.  Recommend follow-up with PCP this week if symptoms are not improving.    Final Clinical Impression(s) / ED Diagnoses Final diagnoses:  Intractable migraine without aura and without status migrainosus    Rx / DC Orders   Results and diagnoses were explained to the patient. Return precautions discussed in full. Patient had no additional questions and expressed complete understanding.   This chart was dictated using voice  recognition software.  Despite best efforts to proofread,  errors can occur which can change the documentation meaning.    Woodroe Chen 03/19/22 1322    Linwood Dibbles, MD 03/22/22 [phone]
